# Patient Record
Sex: Male | Born: 2000 | Race: White | Hispanic: No | Marital: Single | Smoking: Never smoker
Health system: Southern US, Community
[De-identification: ages and names within clinical notes are randomized; demographics above are authoritative.]

---
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
## 2020-01-04 DIAGNOSIS — Z20822 Contact with and (suspected) exposure to covid-19: Secondary | ICD-10-CM | POA: Diagnosis not present

## 2020-01-04 DIAGNOSIS — J029 Acute pharyngitis, unspecified: Secondary | ICD-10-CM | POA: Diagnosis not present

## 2020-01-04 DIAGNOSIS — Z03818 Encounter for observation for suspected exposure to other biological agents ruled out: Secondary | ICD-10-CM | POA: Diagnosis not present

## 2020-01-08 ENCOUNTER — Emergency Department (HOSPITAL_COMMUNITY)
Admission: EM | Admit: 2020-01-08 | Discharge: 2020-01-08 | Disposition: A | Discharge status: Home / Self Care | Payer: BC Managed Care – PPO | Attending: Emergency Medicine | Admitting: Emergency Medicine

## 2020-01-08 ENCOUNTER — Encounter (HOSPITAL_COMMUNITY): Payer: Self-pay | Admitting: *Deleted

## 2020-01-08 ENCOUNTER — Other Ambulatory Visit: Payer: Self-pay

## 2020-01-08 DIAGNOSIS — B2790 Infectious mononucleosis, unspecified without complication: Secondary | ICD-10-CM

## 2020-01-08 DIAGNOSIS — M542 Cervicalgia: Secondary | ICD-10-CM | POA: Diagnosis not present

## 2020-01-08 DIAGNOSIS — R591 Generalized enlarged lymph nodes: Secondary | ICD-10-CM

## 2020-01-08 DIAGNOSIS — R6883 Chills (without fever): Secondary | ICD-10-CM | POA: Diagnosis not present

## 2020-01-08 DIAGNOSIS — R590 Localized enlarged lymph nodes: Secondary | ICD-10-CM | POA: Insufficient documentation

## 2020-01-08 DIAGNOSIS — J029 Acute pharyngitis, unspecified: Secondary | ICD-10-CM | POA: Diagnosis not present

## 2020-01-08 DIAGNOSIS — R599 Enlarged lymph nodes, unspecified: Secondary | ICD-10-CM | POA: Diagnosis not present

## 2020-01-08 DIAGNOSIS — R070 Pain in throat: Secondary | ICD-10-CM | POA: Diagnosis not present

## 2020-01-08 NOTE — ED Triage Notes (Addendum)
Pt has had sore throat for 1 week, [facility] +Mono test, swollen tonsils.Strep test (-)  Sent to ED from there.

## 2020-01-08 NOTE — ED Provider Notes (Signed)
[facility]-EMERGENCY DEPT Provider Note   CSN: [id number] Arrival date & time: 01/08/20  1541     History Chief Complaint  Patient presents with  . Sore Throat    Marcus Snyder is a 19 y.o. male.  HPI   19 year old male presenting the emergency department today for evaluation of sore throat.  Was seen in urgent care prior to arrival and had positive mono test, negative strep/flu and Covid.  They sent him here due to concern for left neck swelling.  He does have some pain to the left neck.  He is able to swallow.  He said no fevers at or other symptoms at home.  No past medical history on file.  Patient Active Problem List   Diagnosis Date Noted  . 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 No pertinent surgical history.     No family history on file.  Social History   Tobacco Use  . Smoking status: Never Smoker  . Smokeless tobacco: Never Used  Substance Use Topics  . Alcohol use: Yes  . Drug use: Never    Home Medications Prior to Admission medications   Medication Sig Start Date End Date Taking? Authorizing Provider  meloxicam (MOBIC) 15 MG tablet One tab PO qAM with breakfast for 2 weeks, then daily prn pain. Patient not taking: Reported on 03/22/2015 02/27/15   Monica Becton, MD    Allergies    Patient has no known allergies.  Review of Systems   Review of Systems  Constitutional: Negative for fever.  HENT: Positive for sore throat.   Respiratory: Negative for cough and shortness of breath.   Cardiovascular: Negative for chest pain.  Gastrointestinal: Negative for abdominal pain and diarrhea.  Musculoskeletal: Negative for back pain.  Neurological: Negative for headaches.    Physical Exam Updated Vital Signs BP 128/80 (BP Location: Right Arm)   Pulse 90   Temp 97.9 F (36.6 C) (Oral)   Resp 16   Ht 5\' 10"  (1.778 m)   Wt 88.5 kg   SpO2 100%    BMI 27.98 kg/m   Physical Exam Constitutional:      General: He is not in acute distress.    Appearance: He is well-developed and well-nourished.  Eyes:     Conjunctiva/sclera: Conjunctivae normal.  Neck:     Comments: Significant lymphadenopathy to the bilat neck (L>R). No erythema or warmth noted. Cardiovascular:     Rate and Rhythm: Normal rate and regular rhythm.  Pulmonary:     Effort: Pulmonary effort is normal.     Breath sounds: Normal breath sounds.  Lymphadenopathy:     Cervical: Cervical adenopathy present.     Left cervical: Superficial cervical adenopathy, deep cervical adenopathy and posterior cervical adenopathy present.  Skin:    General: Skin is warm and dry.  Neurological:     Mental Status: He is alert and oriented to person, place, and time.     ED Results / Procedures / Treatments   Labs (all labs ordered are listed, but only abnormal results are displayed) Labs Reviewed - No data to display  EKG None  Radiology No results found.  Procedures Procedures (including critical care time)  Medications Ordered in ED Medications - No data to display  ED Course  I have reviewed the triage vital signs and the nursing notes.  Pertinent labs & imaging results that were available during my care of the patient  were reviewed by me and considered in my medical decision making (see chart for details).    MDM Rules/Calculators/A&P                          19 year old male presenting the emergency department today for evaluation of neck swelling.  Diagnosed with mono prior to arrival urgent care and was sent here for further evaluation due to his neck swelling.  He does have some significant swelling on the left side of the neck which seems most consistent with lymphadenopathy.  Due to concern for significant swelling, he was offered screening labs, CT of the neck to confirm this diagnosis however he declined.  Advised on symptomatic treatment.  Gave  precautions for mono.  Advised on close follow-up and return precautions.  He voiced understanding plan reasons to return.  Questions answered.  Patient stable for discharge.  Patient was seen in conjunction with Dr. Rodena Medin personally evaluated the patient and is in agreement with plan.   Final Clinical Impression(s) / ED Diagnoses Final diagnoses:  Infectious mononucleosis without complication, infectious mononucleosis due to unspecified organism  Lymphadenopathy    Rx / DC Orders ED Discharge Orders    None       Rayne Du 01/08/20 2234    Wynetta Fines, MD 01/08/20 [phone]

## 2020-01-08 NOTE — Discharge Instructions (Signed)
Please avoid any contact sports or physical activities that could lead to injury as your have a high risk of rupturing your spleen when you have been diagnosed with mononucleosis. You should not go snowboarding next week until you are cleared by your regular doctor.   Please follow up with your primary care provider within 5-7 days for re-evaluation of your symptoms. If you do not have a primary care provider, information for a healthcare clinic has been provided for you to make arrangements for follow up care. Please return to the emergency department for any new or worsening symptoms.

## 2020-02-02 ENCOUNTER — Other Ambulatory Visit: Payer: BC Managed Care – PPO

## 2020-02-02 DIAGNOSIS — Z20822 Contact with and (suspected) exposure to covid-19: Secondary | ICD-10-CM | POA: Diagnosis not present

## 2020-02-04 LAB — SARS-COV-2, NAA 2 DAY TAT

## 2020-02-04 LAB — NOVEL CORONAVIRUS, NAA: SARS-CoV-2, NAA: NOT DETECTED

## 2020-05-03 DIAGNOSIS — J029 Acute pharyngitis, unspecified: Secondary | ICD-10-CM | POA: Diagnosis not present

## 2020-05-28 DIAGNOSIS — Z20822 Contact with and (suspected) exposure to covid-19: Secondary | ICD-10-CM | POA: Diagnosis not present

## 2020-05-28 DIAGNOSIS — J029 Acute pharyngitis, unspecified: Secondary | ICD-10-CM | POA: Diagnosis not present

## 2021-04-16 DIAGNOSIS — G8929 Other chronic pain: Secondary | ICD-10-CM | POA: Diagnosis not present
